# Patient Record
Sex: Female | Born: 1955 | Race: White | Hispanic: No | Marital: Married | Smoking: Never smoker
Health system: Southern US, Community
[De-identification: ages and names within clinical notes are randomized; demographics above are authoritative.]

## PROBLEM LIST (undated history)

## (undated) DIAGNOSIS — E785 Hyperlipidemia, unspecified: Secondary | ICD-10-CM

## (undated) DIAGNOSIS — I10 Essential (primary) hypertension: Secondary | ICD-10-CM

## (undated) DIAGNOSIS — K219 Gastro-esophageal reflux disease without esophagitis: Secondary | ICD-10-CM

## (undated) DIAGNOSIS — K449 Diaphragmatic hernia without obstruction or gangrene: Secondary | ICD-10-CM

## (undated) DIAGNOSIS — E559 Vitamin D deficiency, unspecified: Secondary | ICD-10-CM

## (undated) HISTORY — DX: Essential (primary) hypertension: I10

## (undated) HISTORY — DX: Diaphragmatic hernia without obstruction or gangrene: K44.9

## (undated) HISTORY — DX: Vitamin D deficiency, unspecified: E55.9

## (undated) HISTORY — DX: Hyperlipidemia, unspecified: E78.5

## (undated) HISTORY — DX: Gastro-esophageal reflux disease without esophagitis: K21.9

---

## 2000-04-20 ENCOUNTER — Other Ambulatory Visit: Admission: RE | Admit: 2000-04-20 | Discharge: 2000-04-20 | Payer: Self-pay | Admitting: Obstetrics and Gynecology

## 2001-05-02 ENCOUNTER — Other Ambulatory Visit: Admission: RE | Admit: 2001-05-02 | Discharge: 2001-05-02 | Payer: Self-pay | Admitting: Obstetrics and Gynecology

## 2002-04-15 ENCOUNTER — Encounter: Admission: RE | Admit: 2002-04-15 | Discharge: 2002-04-15 | Payer: Self-pay | Admitting: Emergency Medicine

## 2002-04-15 ENCOUNTER — Encounter: Payer: Self-pay | Admitting: Emergency Medicine

## 2002-05-07 ENCOUNTER — Other Ambulatory Visit: Admission: RE | Admit: 2002-05-07 | Discharge: 2002-05-07 | Payer: Self-pay | Admitting: Obstetrics and Gynecology

## 2002-05-09 ENCOUNTER — Encounter: Payer: Self-pay | Admitting: Obstetrics and Gynecology

## 2002-05-09 ENCOUNTER — Ambulatory Visit (HOSPITAL_COMMUNITY): Admission: RE | Admit: 2002-05-09 | Discharge: 2002-05-09 | Payer: Self-pay | Admitting: Obstetrics and Gynecology

## 2003-03-05 ENCOUNTER — Encounter: Admission: RE | Admit: 2003-03-05 | Discharge: 2003-03-05 | Payer: Self-pay | Admitting: Emergency Medicine

## 2004-05-12 ENCOUNTER — Other Ambulatory Visit: Admission: RE | Admit: 2004-05-12 | Discharge: 2004-05-12 | Payer: Self-pay | Admitting: Obstetrics and Gynecology

## 2006-02-15 ENCOUNTER — Encounter: Admission: RE | Admit: 2006-02-15 | Discharge: 2006-02-15 | Payer: Self-pay | Admitting: Emergency Medicine

## 2008-05-07 ENCOUNTER — Encounter: Admission: RE | Admit: 2008-05-07 | Discharge: 2008-05-07 | Payer: Self-pay | Admitting: Family Medicine

## 2010-07-12 ENCOUNTER — Other Ambulatory Visit: Payer: Self-pay | Admitting: Obstetrics and Gynecology

## 2010-07-12 DIAGNOSIS — Z1231 Encounter for screening mammogram for malignant neoplasm of breast: Secondary | ICD-10-CM

## 2010-07-18 ENCOUNTER — Ambulatory Visit
Admission: RE | Admit: 2010-07-18 | Discharge: 2010-07-18 | Disposition: A | Discharge status: Home / Self Care | Payer: 59 | Source: Ambulatory Visit | Attending: Obstetrics and Gynecology | Admitting: Obstetrics and Gynecology

## 2010-07-18 DIAGNOSIS — Z1231 Encounter for screening mammogram for malignant neoplasm of breast: Secondary | ICD-10-CM

## 2012-06-17 ENCOUNTER — Other Ambulatory Visit: Payer: Self-pay

## 2012-06-17 DIAGNOSIS — Z1231 Encounter for screening mammogram for malignant neoplasm of breast: Secondary | ICD-10-CM

## 2012-07-29 ENCOUNTER — Ambulatory Visit
Admission: RE | Admit: 2012-07-29 | Discharge: 2012-07-29 | Disposition: A | Discharge status: Home / Self Care | Payer: 59 | Source: Ambulatory Visit

## 2012-07-29 DIAGNOSIS — Z1231 Encounter for screening mammogram for malignant neoplasm of breast: Secondary | ICD-10-CM

## 2013-07-04 LAB — CBC AND DIFFERENTIAL
HEMATOCRIT: 42 % (ref 36–46)
Hemoglobin: 13.9 g/dL (ref 12.0–16.0)
NEUTROS ABS: 3 /uL
Platelets: 253 10*3/uL (ref 150–399)
WBC: 5.3 10*3/mL

## 2013-07-04 LAB — HEPATIC FUNCTION PANEL
ALT: 26 U/L (ref 7–35)
AST: 22 U/L (ref 13–35)

## 2013-07-04 LAB — BASIC METABOLIC PANEL
BUN: 15 mg/dL (ref 4–21)
Creatinine: 0.7 mg/dL (ref 0.5–1.1)
GLUCOSE: 98 mg/dL
Potassium: 3.8 mmol/L (ref 3.4–5.3)
Sodium: 138 mmol/L (ref 137–147)

## 2013-07-04 LAB — LIPID PANEL: Cholesterol: 237 mg/dL — AB (ref 0–200)

## 2013-07-22 ENCOUNTER — Other Ambulatory Visit: Payer: Self-pay

## 2013-07-22 DIAGNOSIS — Z1231 Encounter for screening mammogram for malignant neoplasm of breast: Secondary | ICD-10-CM

## 2013-08-05 ENCOUNTER — Encounter (INDEPENDENT_AMBULATORY_CARE_PROVIDER_SITE_OTHER): Payer: Self-pay

## 2013-08-05 ENCOUNTER — Ambulatory Visit
Admission: RE | Admit: 2013-08-05 | Discharge: 2013-08-05 | Disposition: A | Discharge status: Home / Self Care | Payer: BC Managed Care – PPO | Source: Ambulatory Visit

## 2013-08-05 DIAGNOSIS — Z1231 Encounter for screening mammogram for malignant neoplasm of breast: Secondary | ICD-10-CM

## 2013-10-02 ENCOUNTER — Ambulatory Visit (INDEPENDENT_AMBULATORY_CARE_PROVIDER_SITE_OTHER): Payer: BC Managed Care – PPO | Admitting: Internal Medicine

## 2013-10-02 ENCOUNTER — Encounter: Payer: Self-pay | Admitting: Internal Medicine

## 2013-10-02 DIAGNOSIS — K219 Gastro-esophageal reflux disease without esophagitis: Secondary | ICD-10-CM | POA: Insufficient documentation

## 2013-10-02 DIAGNOSIS — I10 Essential (primary) hypertension: Secondary | ICD-10-CM | POA: Insufficient documentation

## 2013-10-02 DIAGNOSIS — Z8639 Personal history of other endocrine, nutritional and metabolic disease: Secondary | ICD-10-CM | POA: Insufficient documentation

## 2013-10-02 DIAGNOSIS — E785 Hyperlipidemia, unspecified: Secondary | ICD-10-CM | POA: Insufficient documentation

## 2013-10-02 MED ORDER — FUROSEMIDE 20 MG PO TABS: 20.0000 mg | ORAL_TABLET | Freq: Every day | ORAL | Status: DC

## 2013-10-02 NOTE — Patient Instructions (Signed)
Please stop HCTZ and start Lasix 20 mg daily in am. Please come back for labs ~1 week before our next appointment in 2 months.

## 2013-10-02 NOTE — Progress Notes (Signed)
Patient ID: Alicia Bailey, female   DOB: 11-Jun-1955, 58 y.o.   MRN: [id number]   HPI  Alicia Bailey is a 58 y.o.-year-old female, referred by her PCP, Dr. Zachery Dauer, for evaluation for hypercalcemia/hyperparathyroidism.  Pt was dx with hypercalcemia in 06/2014. I reviewed pt's pertinent labs - per records from PCP: 09/11/2013: Ca 10.7, iPTH 11 07/06/2013: Ca 11 - on HCTZ 25 mcg daily  Pt has been on HCTZ 25 mg for a long time. She was advised to stop Ca+vit D supplement (600-? once a day) at the end of 06/2013, when Ca was 11 >> Ca checked 2 mo later: 10.7. She continues her MVI.  No previous DEXA scans. No fractures or falls.   No h/o kidney stones.  She has a h/o vitamin D deficiency, but not recently, she has been on Ergocalciferol 2 years ago. Last vit D level was 36.8 in 07/06/2013.  Pt was on calcium and vitamin D, stopped 06/2013; she eats dairy (cheese) and green, leafy, vegetables daily.  No h/o CKD. Last BUN/Cr: 07/06/2013: 15/0.67  No h/o hyperthyroidism, but no recent TFTs available for review.  Pt does not have a FH of hypercalcemia, pituitary tumors, thyroid cancer, or osteoporosis.   I reviewed her chart and she also has a history of HTN, HL.  ROS: Constitutional: + weight gain, + fatigue, no subjective hyperthermia/hypothermia Eyes: no blurry vision, no xerophthalmia ENT: no sore throat, no nodules palpated in throat, no dysphagia/odynophagia, no hoarseness Cardiovascular: no CP/SOB/palpitations/leg swelling Respiratory: no cough/SOB Gastrointestinal: no N/V/D/C/+ heartburn Musculoskeletal: + both: muscle/joint aches Skin: no rashes Neurological: no tremors/numbness/tingling/dizziness Psychiatric: no depression/anxiety  Past Medical History  Diagnosis Date  . Hyperlipidemia   . Hypertension   . GERD (gastroesophageal reflux disease)   . Vitamin D deficiency    History reviewed. No past surgical history.  History   Social History Main Topics  .  Smoking status: Never Smoker   . Smokeless tobacco: No  . Alcohol Use: No  . Drug Use: No   Social History Narrative   Married   Research scientist (medical)   2 children: 33, 31 yrs    First menstrual cycle: 13 yrs   Last menstrual cycle: 40 yrs   3 pregnancies   1 miscarriage   Name  Route  Sig   . estradiol-norethindrone (ACTIVELLA) 1-0.5 MG per tablet   Oral   Take 1 tablet by mouth daily.    Marland Kitchen lisinopril (PRINIVIL,ZESTRIL) 10 MG tablet   Oral   Take 10 mg by mouth daily.    . Multiple Vitamins-Minerals (WOMENS MULTIVITAMIN PLUS PO)   Oral   Take 1 tablet by mouth daily.    . Omega-3 Fatty Acids (FISH OIL PO)   Oral   Take 1 capsule by mouth daily.    . Calcium Carb-Cholecalciferol (CALCIUM 600 + D PO)   Oral   Take 1 capsule by mouth daily.    Marland Kitchen HCTZ 25 MG tablet   Oral   Take 1 tablet by mouth daily.     NKDA  Family History  Problem Relation Age of Onset  . Diabetes Mother   . Hypertension Mother   . Hyperlipidemia Mother   . Hypertension Father   . Hyperlipidemia Father   . Heart disease Father   . Hypertension Sister   . Hypertension Sister    PE: BP 112/64  Pulse 76  Temp(Src) 97.6 F (36.4 C) (Oral)  Resp 12  Ht 5' 2.5" (1.588 m)  Wt 157  lb (71.215 kg)  BMI 28.24 kg/m2  SpO2 97% Wt Readings from Last 3 Encounters:  10/02/13 157 lb (71.215 kg)   Constitutional: overweight, in NAD. No kyphosis. Eyes: PERRLA, EOMI, no exophthalmos ENT: moist mucous membranes, no thyromegaly, no cervical lymphadenopathy Cardiovascular: RRR, No MRG Respiratory: CTA B Gastrointestinal: abdomen soft, NT, ND, BS+ Musculoskeletal: no deformities, strength intact in all 4 Skin: moist, warm, no rashes Neurological: no tremor with outstretched hands, DTR normal in all 4  Assessment: 1. Hypercalcemia with hypoparathyroidism  Plan: Patient has had 2 instances of elevated calcium, with the highest level being at 10.7. A corresponding intact PTH level was low, at 11.  Of note, these were checked while pt was on HCTZ.  No current vitamin D deficiency. No apparent complications from hypercalcemia: no h/o nephrolithiasis, no osteoporosis, no fractures. No abdominal pain, depression, bone pain. - I discussed with the patient about the physiology of calcium and parathyroid hormone, and possible side effects from increased Ca/PTH, including kidney stones, osteoporosis, abdominal pain, depression, etc.  - we discussed that HCTZ can cause elevated serum calcium, and, in the setting of a high calcium, a healthy parathyroid response would be to decrease the production of PTH. Therefore, it is reassuring that her PTH is low while her calcium is high, however, we need to stop her HCTZ and recheck her calcium and PTH level in 2 months, and proceed with further investigation of her hypercalcemia only these are abnormal. In the meantime, to avoid an increase in her blood pressure, we will start furosemide 20 mg daily. I advised her to start checking her blood pressure at home (she does have a cuff) and let me know if her blood pressure is higher. I advised her to come back a week prior to our next appointment in 2 months for the following labs: Orders Placed This Encounter  Procedures  . PTH, Intact and Calcium  . TSH  If these return abnormal, I will then check:  PTHrp Phosphorus vitamin D- 25 HO and 1,25 HO 24h urinary calcium/creatinine ratio - if vit D normal  - I will see the patient back in 2 months  CC: Dr Lavina Hamman

## 2013-11-26 ENCOUNTER — Other Ambulatory Visit: Payer: Self-pay | Admitting: *Deleted

## 2013-11-26 ENCOUNTER — Other Ambulatory Visit (INDEPENDENT_AMBULATORY_CARE_PROVIDER_SITE_OTHER): Payer: BC Managed Care – PPO

## 2013-11-26 LAB — TSH: TSH: 1.36 u[IU]/mL (ref 0.35–4.50)

## 2013-11-27 LAB — PTH, INTACT AND CALCIUM
CALCIUM: 9.9 mg/dL (ref 8.4–10.5)
PTH: 35 pg/mL (ref 14–64)

## 2014-01-25 ENCOUNTER — Other Ambulatory Visit: Payer: Self-pay | Admitting: Internal Medicine

## 2014-01-26 NOTE — Telephone Encounter (Signed)
Further refills per PCP, Dr Zachery DauerBarnes

## 2014-08-05 ENCOUNTER — Other Ambulatory Visit: Payer: Self-pay

## 2014-08-05 DIAGNOSIS — Z1231 Encounter for screening mammogram for malignant neoplasm of breast: Secondary | ICD-10-CM

## 2014-08-13 ENCOUNTER — Ambulatory Visit
Admission: RE | Admit: 2014-08-13 | Discharge: 2014-08-13 | Disposition: A | Discharge status: Home / Self Care | Payer: BLUE CROSS/BLUE SHIELD | Source: Ambulatory Visit

## 2014-08-13 DIAGNOSIS — Z1231 Encounter for screening mammogram for malignant neoplasm of breast: Secondary | ICD-10-CM

## 2015-07-09 DIAGNOSIS — B029 Zoster without complications: Secondary | ICD-10-CM | POA: Diagnosis not present

## 2015-08-10 ENCOUNTER — Other Ambulatory Visit: Payer: Self-pay | Admitting: Obstetrics and Gynecology

## 2015-08-10 DIAGNOSIS — Z1231 Encounter for screening mammogram for malignant neoplasm of breast: Secondary | ICD-10-CM

## 2015-08-20 ENCOUNTER — Ambulatory Visit
Admission: RE | Admit: 2015-08-20 | Discharge: 2015-08-20 | Disposition: A | Discharge status: Home / Self Care | Payer: BLUE CROSS/BLUE SHIELD | Source: Ambulatory Visit | Attending: Obstetrics and Gynecology | Admitting: Obstetrics and Gynecology

## 2015-08-20 DIAGNOSIS — Z1231 Encounter for screening mammogram for malignant neoplasm of breast: Secondary | ICD-10-CM

## 2015-08-31 DIAGNOSIS — E7800 Pure hypercholesterolemia, unspecified: Secondary | ICD-10-CM | POA: Diagnosis not present

## 2015-08-31 DIAGNOSIS — I10 Essential (primary) hypertension: Secondary | ICD-10-CM | POA: Diagnosis not present

## 2015-08-31 DIAGNOSIS — E559 Vitamin D deficiency, unspecified: Secondary | ICD-10-CM | POA: Diagnosis not present

## 2015-08-31 DIAGNOSIS — R609 Edema, unspecified: Secondary | ICD-10-CM | POA: Diagnosis not present

## 2015-09-30 DIAGNOSIS — Z1389 Encounter for screening for other disorder: Secondary | ICD-10-CM | POA: Diagnosis not present

## 2015-09-30 DIAGNOSIS — Z01419 Encounter for gynecological examination (general) (routine) without abnormal findings: Secondary | ICD-10-CM | POA: Diagnosis not present

## 2015-09-30 DIAGNOSIS — Z130 Encounter for screening for diseases of the blood and blood-forming organs and certain disorders involving the immune mechanism: Secondary | ICD-10-CM | POA: Diagnosis not present

## 2015-09-30 DIAGNOSIS — Z6828 Body mass index (BMI) 28.0-28.9, adult: Secondary | ICD-10-CM | POA: Diagnosis not present

## 2015-11-10 DIAGNOSIS — Z23 Encounter for immunization: Secondary | ICD-10-CM | POA: Diagnosis not present

## 2016-03-09 DIAGNOSIS — E7800 Pure hypercholesterolemia, unspecified: Secondary | ICD-10-CM | POA: Diagnosis not present

## 2016-03-09 DIAGNOSIS — I10 Essential (primary) hypertension: Secondary | ICD-10-CM | POA: Diagnosis not present

## 2016-03-09 DIAGNOSIS — R011 Cardiac murmur, unspecified: Secondary | ICD-10-CM | POA: Diagnosis not present

## 2016-03-09 DIAGNOSIS — E559 Vitamin D deficiency, unspecified: Secondary | ICD-10-CM | POA: Diagnosis not present

## 2016-03-31 DIAGNOSIS — Z1211 Encounter for screening for malignant neoplasm of colon: Secondary | ICD-10-CM | POA: Diagnosis not present

## 2016-03-31 DIAGNOSIS — Z01818 Encounter for other preprocedural examination: Secondary | ICD-10-CM | POA: Diagnosis not present

## 2016-04-27 DIAGNOSIS — J028 Acute pharyngitis due to other specified organisms: Secondary | ICD-10-CM | POA: Diagnosis not present

## 2016-04-27 DIAGNOSIS — J069 Acute upper respiratory infection, unspecified: Secondary | ICD-10-CM | POA: Diagnosis not present

## 2016-06-16 ENCOUNTER — Telehealth: Payer: Self-pay

## 2016-06-16 NOTE — Telephone Encounter (Signed)
SENT NOTES TO SCHEDULING 

## 2016-07-14 DIAGNOSIS — Z1211 Encounter for screening for malignant neoplasm of colon: Secondary | ICD-10-CM | POA: Diagnosis not present

## 2016-07-14 DIAGNOSIS — D126 Benign neoplasm of colon, unspecified: Secondary | ICD-10-CM | POA: Diagnosis not present

## 2016-07-18 DIAGNOSIS — Z1211 Encounter for screening for malignant neoplasm of colon: Secondary | ICD-10-CM | POA: Diagnosis not present

## 2016-07-18 DIAGNOSIS — D126 Benign neoplasm of colon, unspecified: Secondary | ICD-10-CM | POA: Diagnosis not present

## 2016-07-25 DIAGNOSIS — H5213 Myopia, bilateral: Secondary | ICD-10-CM | POA: Diagnosis not present

## 2016-09-04 ENCOUNTER — Other Ambulatory Visit: Payer: Self-pay | Admitting: Obstetrics and Gynecology

## 2016-09-04 DIAGNOSIS — Z1231 Encounter for screening mammogram for malignant neoplasm of breast: Secondary | ICD-10-CM

## 2016-09-21 ENCOUNTER — Ambulatory Visit
Admission: RE | Admit: 2016-09-21 | Discharge: 2016-09-21 | Disposition: A | Discharge status: Home / Self Care | Payer: BLUE CROSS/BLUE SHIELD | Source: Ambulatory Visit | Attending: Obstetrics and Gynecology | Admitting: Obstetrics and Gynecology

## 2016-09-21 DIAGNOSIS — Z1231 Encounter for screening mammogram for malignant neoplasm of breast: Secondary | ICD-10-CM

## 2016-11-10 DIAGNOSIS — Z23 Encounter for immunization: Secondary | ICD-10-CM | POA: Diagnosis not present

## 2016-11-23 DIAGNOSIS — Z130 Encounter for screening for diseases of the blood and blood-forming organs and certain disorders involving the immune mechanism: Secondary | ICD-10-CM | POA: Diagnosis not present

## 2016-11-23 DIAGNOSIS — Z6828 Body mass index (BMI) 28.0-28.9, adult: Secondary | ICD-10-CM | POA: Diagnosis not present

## 2016-11-23 DIAGNOSIS — Z01419 Encounter for gynecological examination (general) (routine) without abnormal findings: Secondary | ICD-10-CM | POA: Diagnosis not present

## 2016-11-23 DIAGNOSIS — Z1389 Encounter for screening for other disorder: Secondary | ICD-10-CM | POA: Diagnosis not present

## 2016-11-23 DIAGNOSIS — N951 Menopausal and female climacteric states: Secondary | ICD-10-CM | POA: Diagnosis not present

## 2016-12-15 DIAGNOSIS — E7800 Pure hypercholesterolemia, unspecified: Secondary | ICD-10-CM | POA: Diagnosis not present

## 2016-12-15 DIAGNOSIS — E559 Vitamin D deficiency, unspecified: Secondary | ICD-10-CM | POA: Diagnosis not present

## 2016-12-15 DIAGNOSIS — I10 Essential (primary) hypertension: Secondary | ICD-10-CM | POA: Diagnosis not present

## 2016-12-19 DIAGNOSIS — K449 Diaphragmatic hernia without obstruction or gangrene: Secondary | ICD-10-CM | POA: Diagnosis not present

## 2016-12-19 DIAGNOSIS — I10 Essential (primary) hypertension: Secondary | ICD-10-CM | POA: Diagnosis not present

## 2016-12-19 DIAGNOSIS — E559 Vitamin D deficiency, unspecified: Secondary | ICD-10-CM | POA: Diagnosis not present

## 2016-12-19 DIAGNOSIS — E7800 Pure hypercholesterolemia, unspecified: Secondary | ICD-10-CM | POA: Diagnosis not present

## 2016-12-19 DIAGNOSIS — Z0000 Encounter for general adult medical examination without abnormal findings: Secondary | ICD-10-CM | POA: Diagnosis not present

## 2017-01-04 DIAGNOSIS — H00011 Hordeolum externum right upper eyelid: Secondary | ICD-10-CM | POA: Diagnosis not present

## 2017-01-26 DIAGNOSIS — H40023 Open angle with borderline findings, high risk, bilateral: Secondary | ICD-10-CM | POA: Diagnosis not present

## 2017-01-26 DIAGNOSIS — H40053 Ocular hypertension, bilateral: Secondary | ICD-10-CM | POA: Diagnosis not present

## 2017-01-30 DIAGNOSIS — L814 Other melanin hyperpigmentation: Secondary | ICD-10-CM | POA: Diagnosis not present

## 2017-01-30 DIAGNOSIS — L570 Actinic keratosis: Secondary | ICD-10-CM | POA: Diagnosis not present

## 2017-01-30 DIAGNOSIS — D2261 Melanocytic nevi of right upper limb, including shoulder: Secondary | ICD-10-CM | POA: Diagnosis not present

## 2017-02-07 DIAGNOSIS — J069 Acute upper respiratory infection, unspecified: Secondary | ICD-10-CM | POA: Diagnosis not present

## 2017-11-26 ENCOUNTER — Other Ambulatory Visit: Payer: Self-pay | Admitting: Obstetrics and Gynecology

## 2017-11-26 DIAGNOSIS — Z1231 Encounter for screening mammogram for malignant neoplasm of breast: Secondary | ICD-10-CM

## 2017-11-30 DIAGNOSIS — Z1389 Encounter for screening for other disorder: Secondary | ICD-10-CM | POA: Diagnosis not present

## 2017-11-30 DIAGNOSIS — Z6826 Body mass index (BMI) 26.0-26.9, adult: Secondary | ICD-10-CM | POA: Diagnosis not present

## 2017-11-30 DIAGNOSIS — Z130 Encounter for screening for diseases of the blood and blood-forming organs and certain disorders involving the immune mechanism: Secondary | ICD-10-CM | POA: Diagnosis not present

## 2017-11-30 DIAGNOSIS — Z01419 Encounter for gynecological examination (general) (routine) without abnormal findings: Secondary | ICD-10-CM | POA: Diagnosis not present

## 2017-12-28 DIAGNOSIS — Z23 Encounter for immunization: Secondary | ICD-10-CM | POA: Diagnosis not present

## 2017-12-28 DIAGNOSIS — Z0000 Encounter for general adult medical examination without abnormal findings: Secondary | ICD-10-CM | POA: Diagnosis not present

## 2017-12-28 DIAGNOSIS — E7800 Pure hypercholesterolemia, unspecified: Secondary | ICD-10-CM | POA: Diagnosis not present

## 2017-12-28 DIAGNOSIS — E559 Vitamin D deficiency, unspecified: Secondary | ICD-10-CM | POA: Diagnosis not present

## 2017-12-28 DIAGNOSIS — I10 Essential (primary) hypertension: Secondary | ICD-10-CM | POA: Diagnosis not present

## 2017-12-28 DIAGNOSIS — Z1159 Encounter for screening for other viral diseases: Secondary | ICD-10-CM | POA: Diagnosis not present

## 2017-12-31 ENCOUNTER — Other Ambulatory Visit (HOSPITAL_COMMUNITY): Payer: Self-pay | Admitting: Family Medicine

## 2017-12-31 DIAGNOSIS — R011 Cardiac murmur, unspecified: Secondary | ICD-10-CM

## 2018-01-11 ENCOUNTER — Ambulatory Visit: Payer: BLUE CROSS/BLUE SHIELD

## 2018-01-11 DIAGNOSIS — H1089 Other conjunctivitis: Secondary | ICD-10-CM | POA: Diagnosis not present

## 2018-02-21 ENCOUNTER — Ambulatory Visit
Admission: RE | Admit: 2018-02-21 | Discharge: 2018-02-21 | Disposition: A | Discharge status: Home / Self Care | Payer: BLUE CROSS/BLUE SHIELD | Source: Ambulatory Visit | Attending: Obstetrics and Gynecology | Admitting: Obstetrics and Gynecology

## 2018-02-21 DIAGNOSIS — Z1231 Encounter for screening mammogram for malignant neoplasm of breast: Secondary | ICD-10-CM | POA: Diagnosis not present

## 2018-02-26 DIAGNOSIS — H40013 Open angle with borderline findings, low risk, bilateral: Secondary | ICD-10-CM | POA: Diagnosis not present

## 2018-02-28 ENCOUNTER — Ambulatory Visit (HOSPITAL_COMMUNITY): Payer: BLUE CROSS/BLUE SHIELD | Attending: Cardiology

## 2018-02-28 ENCOUNTER — Other Ambulatory Visit (HOSPITAL_COMMUNITY): Payer: BLUE CROSS/BLUE SHIELD

## 2018-02-28 DIAGNOSIS — R011 Cardiac murmur, unspecified: Secondary | ICD-10-CM | POA: Diagnosis not present

## 2018-03-20 ENCOUNTER — Encounter: Payer: Self-pay | Admitting: Cardiology

## 2018-03-27 ENCOUNTER — Encounter: Payer: Self-pay | Admitting: *Deleted

## 2018-03-27 DIAGNOSIS — N951 Menopausal and female climacteric states: Secondary | ICD-10-CM | POA: Insufficient documentation

## 2018-04-03 ENCOUNTER — Telehealth: Payer: Self-pay | Admitting: Cardiology

## 2018-04-03 NOTE — Telephone Encounter (Signed)
Patient returned call,would like a call back 

## 2018-04-03 NOTE — Telephone Encounter (Signed)
Patient called today to reschedule office visit due to coronavirus pandemic.  Patient was referred for evaluation of 2D echocardiogram showing a bicuspid aortic valve with no significant aortic stenosis or AI.  There was mild to moderate TR as well.  Please contact patient to make sure she is not having any symptoms.  If she is not having any symptoms then okay to reschedule as a priority #3.

## 2018-04-03 NOTE — Telephone Encounter (Signed)
Dr. Mayford Knife reviewed the echo with the patient. The patient was asymptomatic and Dr. Mayford Knife wanted to follow up with her in 3 months.

## 2018-04-04 ENCOUNTER — Ambulatory Visit: Payer: BLUE CROSS/BLUE SHIELD | Admitting: Cardiology

## 2018-08-02 ENCOUNTER — Telehealth: Payer: Self-pay | Admitting: Cardiology

## 2018-08-02 NOTE — Telephone Encounter (Signed)
New Message ° ° ° °Left message to confirm appt and answer covid questions  °

## 2018-08-05 ENCOUNTER — Ambulatory Visit: Payer: BLUE CROSS/BLUE SHIELD | Admitting: Cardiology

## 2018-08-23 DIAGNOSIS — H40013 Open angle with borderline findings, low risk, bilateral: Secondary | ICD-10-CM | POA: Diagnosis not present

## 2018-09-03 ENCOUNTER — Encounter: Payer: Self-pay | Admitting: Cardiology

## 2018-09-17 ENCOUNTER — Ambulatory Visit: Payer: BC Managed Care – PPO | Admitting: Cardiology

## 2018-10-21 ENCOUNTER — Telehealth: Payer: Self-pay | Admitting: Cardiology

## 2018-10-21 NOTE — Telephone Encounter (Signed)
Left message for pt letting her know it would be better to do virtual or move appt since we do not know what is causing her cough.  Advised to call back and adjust appt.

## 2018-10-21 NOTE — Telephone Encounter (Signed)
New Message:      Pt has an appointment with Dr Radford Pax tomorrow. She says she have an allergy like cold. She have been coughing for about a week, but no fever. She wants to know if she should still come in for her appointment tomorrow?

## 2018-10-22 ENCOUNTER — Encounter: Payer: Self-pay | Admitting: Cardiology

## 2018-10-22 ENCOUNTER — Telehealth (INDEPENDENT_AMBULATORY_CARE_PROVIDER_SITE_OTHER): Payer: BC Managed Care – PPO | Admitting: Cardiology

## 2018-10-22 ENCOUNTER — Other Ambulatory Visit: Payer: Self-pay

## 2018-10-22 VITALS — Ht 62.5 in | Wt 153.0 lb

## 2018-10-22 DIAGNOSIS — I10 Essential (primary) hypertension: Secondary | ICD-10-CM

## 2018-10-22 DIAGNOSIS — I519 Heart disease, unspecified: Secondary | ICD-10-CM | POA: Diagnosis not present

## 2018-10-22 DIAGNOSIS — I071 Rheumatic tricuspid insufficiency: Secondary | ICD-10-CM

## 2018-10-22 NOTE — Progress Notes (Signed)
Virtual Visit via Video Note   This visit type was conducted due to national recommendations for restrictions regarding the COVID-19 Pandemic (e.g. social distancing) in an effort to limit this patient's exposure and mitigate transmission in our community.  Due to her co-morbid illnesses, this patient is at least at moderate risk for complications without adequate follow up.  This format is felt to be most appropriate for this patient at this time.  All issues noted in this document were discussed and addressed.  A limited physical exam was performed with this format.  Please refer to the patient's chart for her consent to telehealth for [facility].   Evaluation Performed:  Cardiology Consult  This visit type was conducted due to national recommendations for restrictions regarding the COVID-19 Pandemic (e.g. social distancing).  This format is felt to be most appropriate for this patient at this time.  All issues noted in this document were discussed and addressed.  No physical exam was performed (except for noted visual exam findings with Video Visits).  Please refer to the patient's chart (MyChart message for video visits and phone note for telephone visits) for the patient's consent to telehealth for [facility].  Date:  10/22/2018   ID:  Alicia Bailey, DOB 09-29-1955, MRN [id number]  Patient Location:  Home  Provider location:   [place]  PCP:  Juluis Rainier, MD  Cardiologist:  NEW Electrophysiologist:  None   Chief Complaint:  Abnormal echo with mild to moderate TR  History of Present Illness:    Alicia Bailey is a 63 y.o. female who presents via audio/video conferencing for a telehealth visit today in referral by Juluis Rainier, MD for evaluation of mild to moderate TR.  She had a routine PE in February and was noted to have a heart murmur.  2D echo was done which showed normal LVF with EF 55-60% with increased stiffness of heart muscle, mildly reduced RVF and  mild to moderate TR.  AV was poorly visualized but could not rule out BAV. PASP was .    She is here today for followup and is doing well.  She denies any chest pain or pressure, SOB, DOE, PND, orthopnea, LE edema, dizziness, palpitations or syncope. She is compliant with her meds and is tolerating meds with no SE.    The patient does not have symptoms concerning for COVID-19 infection (fever, chills, cough, or new shortness of breath).    Prior CV studies:   The following studies were reviewed today:  2D echo  Past Medical History:  Diagnosis Date  . GERD (gastroesophageal reflux disease)   . Hiatal hernia   . Hyperlipidemia   . Hypertension   . Vitamin D deficiency    No past surgical history on file.   Current Meds  Medication Sig  . furosemide (LASIX) 20 MG tablet Take 20 mg by mouth daily.  Marland Kitchen lisinopril (PRINIVIL,ZESTRIL) 10 MG tablet Take 10 mg by mouth daily.  . Multiple Vitamins-Minerals (WOMENS MULTIVITAMIN PLUS PO) Take 1 tablet by mouth daily.  . Omega-3 Fatty Acids (FISH OIL PO) Take 1 capsule by mouth daily.     Allergies:   Patient has no known allergies.   Social History   Tobacco Use  . Smoking status: Never Smoker  . Smokeless tobacco: Never Used  Substance Use Topics  . Alcohol use: No  . Drug use: No     Family Hx: The patient's family history includes Breast cancer (age of onset: 74) in  her mother; CAD in her father; Diabetes in her mother; Heart disease in her father; Hyperlipidemia in her father and mother; Hypertension in her father, mother, sister, and sister.  ROS:   Please see the history of present illness.     All other systems reviewed and are negative.   Labs/Other Tests and Data Reviewed:    Recent Labs: No results found for requested labs within last 8760 hours.   Recent Lipid Panel Lab Results  Component Value Date/Time   CHOL 237 (A) 07/04/2013    Wt Readings from Last 3 Encounters:  10/22/18 153 lb (69.4 kg)   10/02/13 157 lb (71.2 kg)     Objective:    Vital Signs:  Ht 5' 2.5" (1.588 m)   Wt 153 lb (69.4 kg)   BMI 27.54 kg/m    CONSTITUTIONAL:  Well nourished, well developed female in no acute distress.  EYES: anicteric MOUTH: oral mucosa is pink RESPIRATORY: Normal respiratory effort, symmetric expansion CARDIOVASCULAR: No peripheral edema SKIN: No rash, lesions or ulcers MUSCULOSKELETAL: no digital cyanosis NEURO: Cranial Nerves II-XII grossly intact, moves all extremities PSYCH: Intact judgement and insight.  A&O x 3, Mood/affect appropriate   ASSESSMENT & PLAN:    1.  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  Mild RV dysfunction -TAPSE normal on echo -no significant pulmonary HTN -I will check a cardiac MRI to assess RV further and rule out RV dysplasia -is RVF noted to be reduced then will get VQ scan to rule out chronic thromboembolic dz as well as PFTs and sleep study.  3.  HTN -BP controlled -continue Lisinopril 10mg  daily  COVID-19 Education: The signs and symptoms of COVID-19 were discussed with the patient and how to seek care for testing (follow up with PCP or arrange E-visit).  The importance of social distancing was discussed today.  Patient Risk:   After full review of this patient's clinical status, I feel that they are at least moderate risk at this time.  Time:   Today, I have spent 20 minutes directly with the patient on telemedicine discussing medical problems including TR.  We also reviewed the symptoms of COVID 19 and the ways to protect against contracting the virus with telehealth technology.  I spent an additional 5 minutes reviewing patient's chart including 2D echo.  Medication Adjustments/Labs and Tests Ordered: Current medicines are reviewed at length with the patient today.  Concerns regarding medicines are outlined above.  Tests Ordered: No orders of the defined types were placed in this encounter.  Medication Changes: No  orders of the defined types were placed in this encounter.   Disposition:  Follow up in 1 year(s)  Signed, Fransico Him, MD  10/22/2018 8:40 AM    [facility]

## 2018-10-22 NOTE — Patient Instructions (Signed)
Medication Instructions:  If you need a refill on your cardiac medications before your next appointment, please call your pharmacy.   Lab work: If you have labs (blood work) drawn today and your tests are completely normal, you will receive your results only by: Marland Kitchen MyChart Message (if you have MyChart) OR . A paper copy in the mail If you have any lab test that is abnormal or we need to change your treatment, we will call you to review the results.  Testing/Procedures: Your physician has requested that you have a cardiac MRI. Cardiac MRI uses a computer to create images of your heart as its beating, producing both still and moving pictures of your heart and major blood vessels. For further information please visit [email or web address] Please follow the instruction sheet given to you today for more information.  Follow-Up: At [facility], you and your health needs are our priority.  As part of our continuing mission to provide you with exceptional heart care, we have created designated Provider Care Teams.  These Care Teams include your primary Cardiologist (physician) and Advanced Practice Providers (APPs -  Physician Assistants and Nurse Practitioners) who all work together to provide you with the care you need, when you need it. Your physician recommends that you schedule a follow-up appointment as needed with Dr. Radford Pax.

## 2018-10-22 NOTE — Addendum Note (Signed)
Addended by: Aris Georgia, Telly Jawad L on: 10/22/2018 09:05 AM   Modules accepted: Orders

## 2018-10-25 ENCOUNTER — Encounter: Payer: Self-pay | Admitting: Cardiology

## 2018-11-04 IMAGING — MG 2D DIGITAL SCREENING BILATERAL MAMMOGRAM WITH CAD AND ADJUNCT TO
8 of 13 series · 8 of 29 positions shown · non-contrast
Comparison: Previous exam(s).

CLINICAL DATA: Screening.

EXAM:
2D DIGITAL SCREENING BILATERAL MAMMOGRAM WITH CAD AND ADJUNCT TOMO

[L CC (1 of 2)]
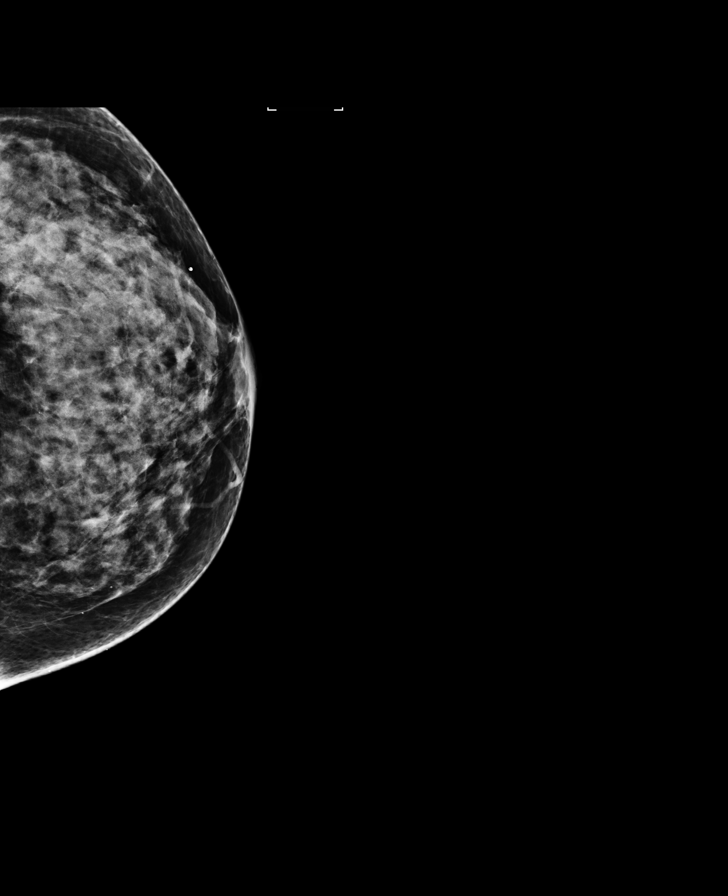

[R MLO]
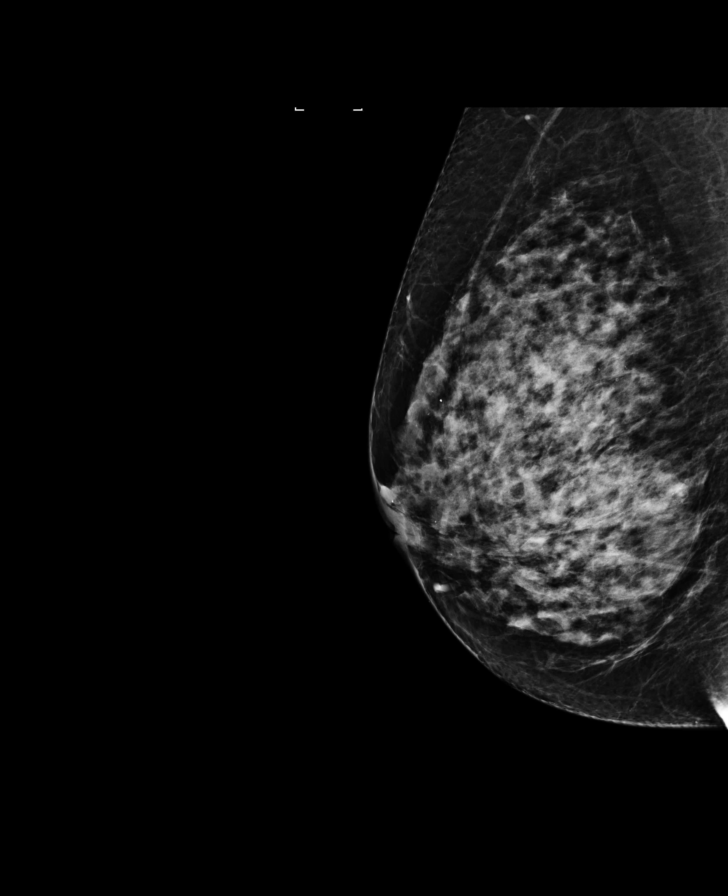

[L CC synth-2D]
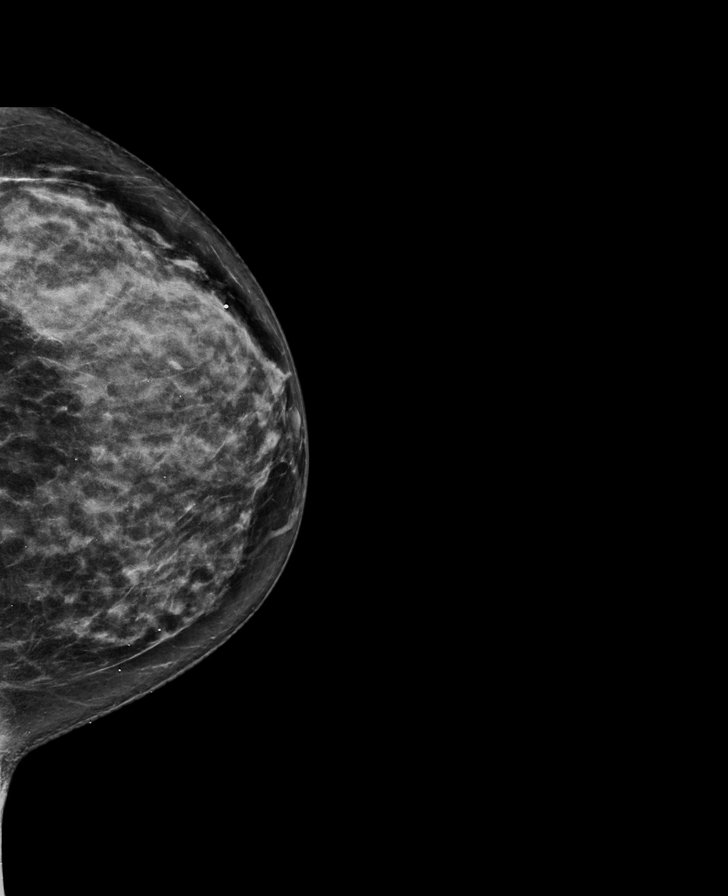

[L CC (2 of 2)]
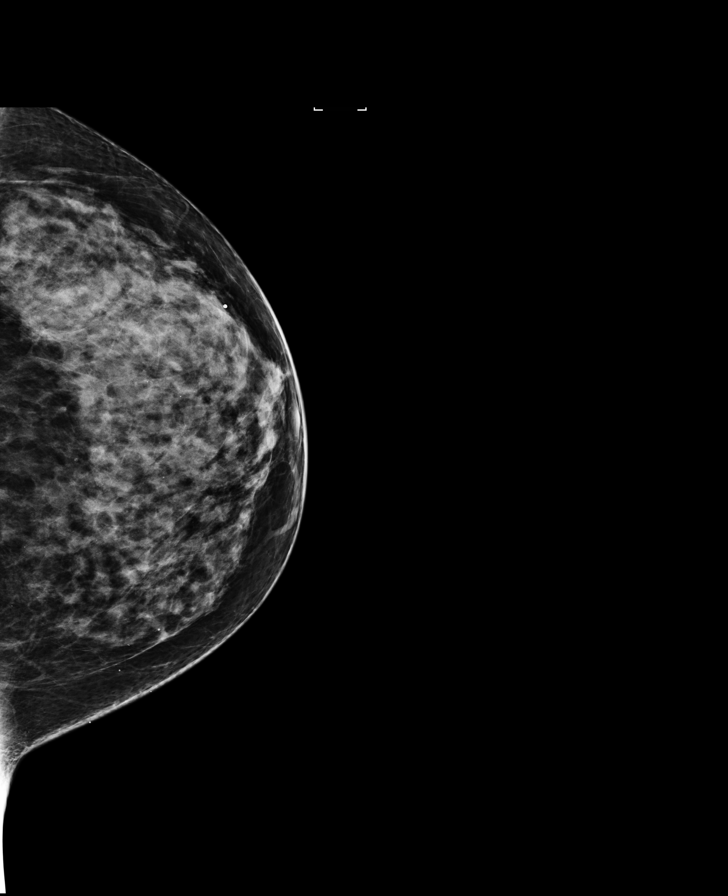

[R CC]
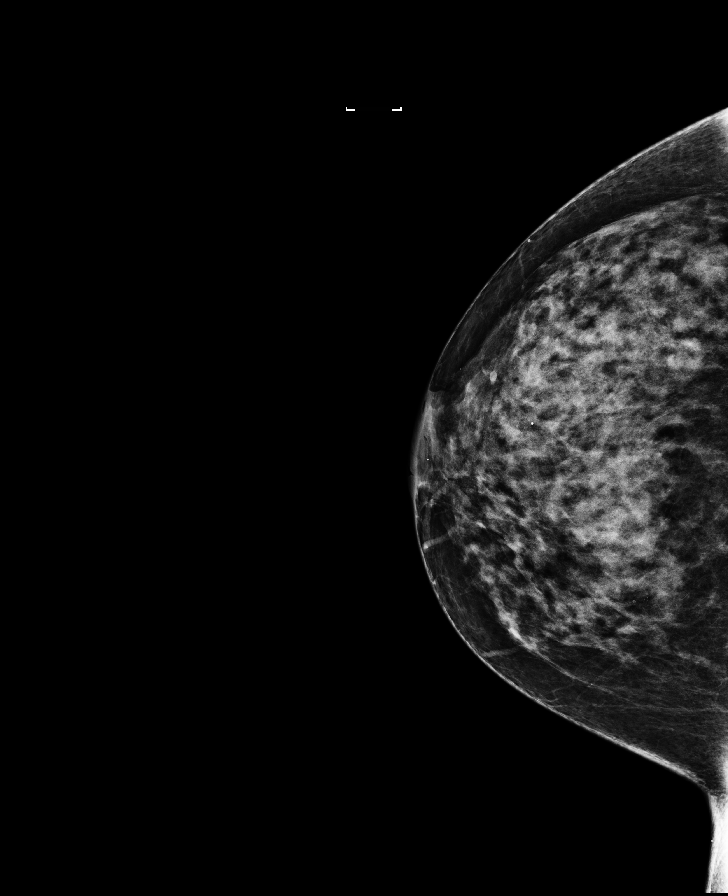

[R MLO synth-2D]
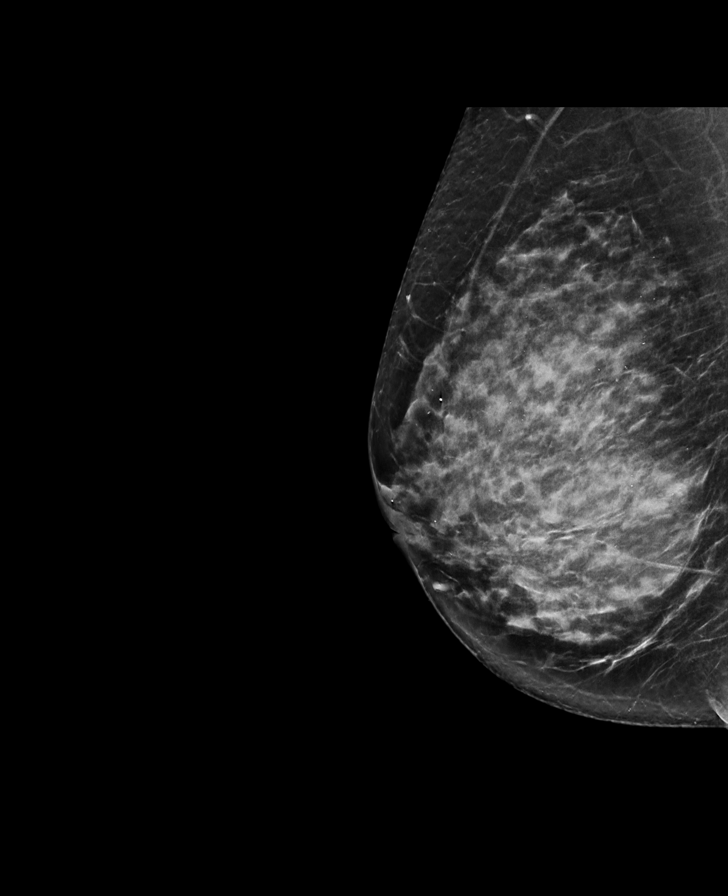

[R CC synth-2D]
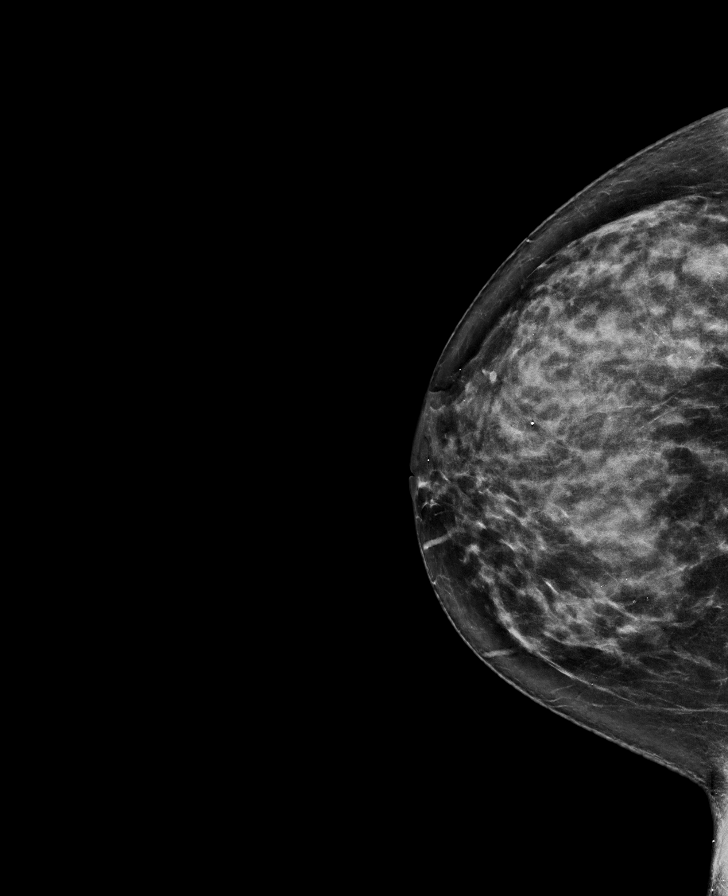

[L MLO synth-2D]
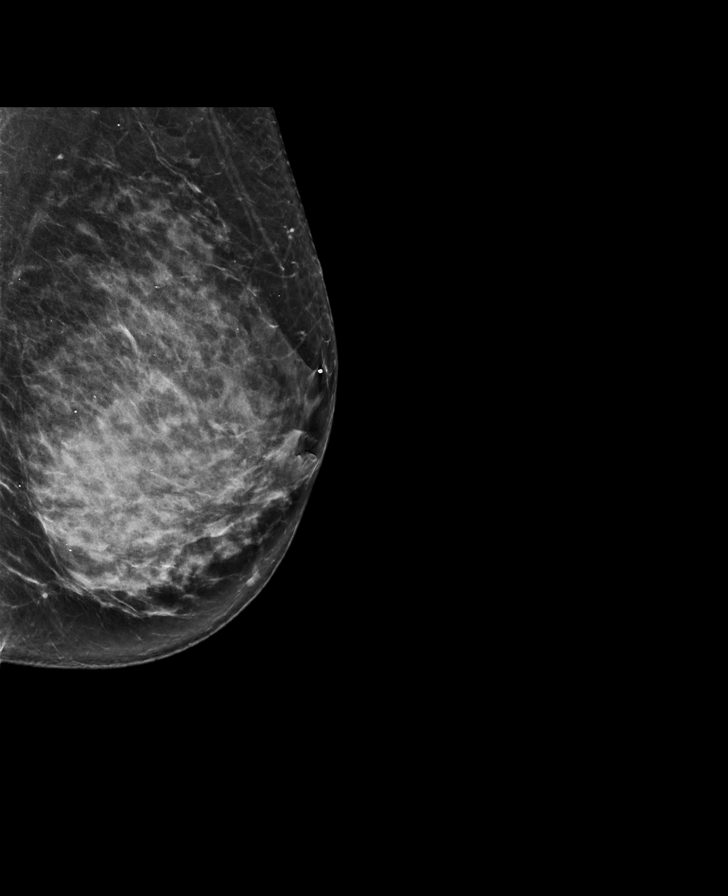

[8 of 29 positions shown; findings below may reference images not displayed]

ACR Breast Density Category d: The breast tissue is extremely dense,
which lowers the sensitivity of mammography.
FINDINGS: There are no findings suspicious for malignancy. Images were
processed with CAD.
IMPRESSION: No mammographic evidence of malignancy. A result letter of this
screening mammogram will be mailed directly to the patient.

RECOMMENDATION:
Screening mammogram in one year. (Code:US-D-RZ7)

BI-RADS CATEGORY  1: Negative.

## 2018-11-14 ENCOUNTER — Telehealth (HOSPITAL_COMMUNITY): Payer: Self-pay | Admitting: Emergency Medicine

## 2018-11-14 NOTE — Telephone Encounter (Signed)
Reaching out to patient to offer assistance regarding upcoming cardiac imaging study; pt verbalizes understanding of appt date/time, parking situation and where to check in, and verified current allergies; name and call back number provided for further questions should they arise Marchia Bond RN Navigator Cardiac Imaging [facility] [phone] office [phone] cell  Pt denies implants, denies claustrophobia

## 2018-11-15 ENCOUNTER — Other Ambulatory Visit: Payer: Self-pay

## 2018-11-15 ENCOUNTER — Telehealth: Payer: Self-pay

## 2018-11-15 ENCOUNTER — Ambulatory Visit (HOSPITAL_COMMUNITY)
Admission: RE | Admit: 2018-11-15 | Discharge: 2018-11-15 | Disposition: A | Discharge status: Home / Self Care | Payer: BC Managed Care – PPO | Source: Ambulatory Visit | Attending: Cardiology | Admitting: Cardiology

## 2018-11-15 DIAGNOSIS — I10 Essential (primary) hypertension: Secondary | ICD-10-CM | POA: Diagnosis not present

## 2018-11-15 DIAGNOSIS — I519 Heart disease, unspecified: Secondary | ICD-10-CM | POA: Insufficient documentation

## 2018-11-15 DIAGNOSIS — I071 Rheumatic tricuspid insufficiency: Secondary | ICD-10-CM | POA: Insufficient documentation

## 2018-11-15 LAB — CREATININE, SERUM
Creatinine, Ser: 0.67 mg/dL (ref 0.44–1.00)
GFR calc Af Amer: >60 mL/min (ref >60)
GFR calc non Af Amer: >60 mL/min (ref >60)

## 2018-11-15 MED ORDER — GADOBUTROL 1 MMOL/ML IV SOLN
8.0000 mL | Freq: Once | INTRAVENOUS | Status: AC | PRN
  Administered 2018-11-15: 8 mL via INTRAVENOUS

## 2018-11-15 NOTE — Telephone Encounter (Signed)
Notes recorded by Frederik Schmidt, RN on 11/15/2018 at 3:32 PM EDT  The patient has been notified of the result and verbalized understanding. All questions (if any) were answered.  Frederik Schmidt, RN 11/15/2018 3:32 PM

## 2018-11-15 NOTE — Telephone Encounter (Signed)
-----   Message from Sueanne Margarita, MD sent at 11/15/2018 10:03 AM EDT ----- Please let patient know that labs were normal.  Continue current medical therapy.

## 2018-11-18 ENCOUNTER — Telehealth: Payer: Self-pay

## 2018-11-18 NOTE — Telephone Encounter (Signed)
-----   Message from Sueanne Margarita, MD sent at 11/18/2018  8:19 AM EST ----- Cardiac MRI normal with mildly leaky TV

## 2018-11-18 NOTE — Telephone Encounter (Signed)
Notes recorded by Frederik Schmidt, RN on 11/18/2018 at 9:23 AM EST  The patient has been notified of the result and verbalized understanding. All questions (if any) were answered.  Frederik Schmidt, RN 11/18/2018 9:23 AM

## 2018-12-30 DIAGNOSIS — E7800 Pure hypercholesterolemia, unspecified: Secondary | ICD-10-CM | POA: Diagnosis not present

## 2018-12-30 DIAGNOSIS — K219 Gastro-esophageal reflux disease without esophagitis: Secondary | ICD-10-CM | POA: Diagnosis not present

## 2018-12-30 DIAGNOSIS — I10 Essential (primary) hypertension: Secondary | ICD-10-CM | POA: Diagnosis not present

## 2018-12-30 DIAGNOSIS — E559 Vitamin D deficiency, unspecified: Secondary | ICD-10-CM | POA: Diagnosis not present

## 2019-07-14 ENCOUNTER — Other Ambulatory Visit: Payer: Self-pay | Admitting: Obstetrics and Gynecology

## 2019-07-14 DIAGNOSIS — Z1231 Encounter for screening mammogram for malignant neoplasm of breast: Secondary | ICD-10-CM

## 2019-07-30 ENCOUNTER — Other Ambulatory Visit: Payer: Self-pay

## 2019-07-30 ENCOUNTER — Ambulatory Visit
Admission: RE | Admit: 2019-07-30 | Discharge: 2019-07-30 | Disposition: A | Discharge status: Home / Self Care | Payer: BC Managed Care – PPO | Source: Ambulatory Visit | Attending: Obstetrics and Gynecology | Admitting: Obstetrics and Gynecology

## 2019-07-30 DIAGNOSIS — Z1231 Encounter for screening mammogram for malignant neoplasm of breast: Secondary | ICD-10-CM | POA: Diagnosis not present

## 2019-09-24 DIAGNOSIS — R0989 Other specified symptoms and signs involving the circulatory and respiratory systems: Secondary | ICD-10-CM | POA: Diagnosis not present

## 2019-09-24 DIAGNOSIS — R05 Cough: Secondary | ICD-10-CM | POA: Diagnosis not present

## 2019-09-24 DIAGNOSIS — J392 Other diseases of pharynx: Secondary | ICD-10-CM | POA: Diagnosis not present

## 2019-09-30 DIAGNOSIS — R05 Cough: Secondary | ICD-10-CM | POA: Diagnosis not present

## 2020-01-21 DIAGNOSIS — E7800 Pure hypercholesterolemia, unspecified: Secondary | ICD-10-CM | POA: Diagnosis not present

## 2020-01-21 DIAGNOSIS — I10 Essential (primary) hypertension: Secondary | ICD-10-CM | POA: Diagnosis not present

## 2020-01-22 DIAGNOSIS — H401131 Primary open-angle glaucoma, bilateral, mild stage: Secondary | ICD-10-CM | POA: Diagnosis not present

## 2020-01-24 DIAGNOSIS — U071 COVID-19: Secondary | ICD-10-CM | POA: Diagnosis not present

## 2020-01-24 DIAGNOSIS — J069 Acute upper respiratory infection, unspecified: Secondary | ICD-10-CM | POA: Diagnosis not present

## 2020-02-05 DIAGNOSIS — H401131 Primary open-angle glaucoma, bilateral, mild stage: Secondary | ICD-10-CM | POA: Diagnosis not present

## 2020-02-10 DIAGNOSIS — I10 Essential (primary) hypertension: Secondary | ICD-10-CM | POA: Diagnosis not present

## 2020-02-10 DIAGNOSIS — E7800 Pure hypercholesterolemia, unspecified: Secondary | ICD-10-CM | POA: Diagnosis not present

## 2020-07-13 ENCOUNTER — Other Ambulatory Visit: Payer: Self-pay | Admitting: Obstetrics and Gynecology

## 2020-07-13 DIAGNOSIS — R600 Localized edema: Secondary | ICD-10-CM | POA: Diagnosis not present

## 2020-07-13 DIAGNOSIS — Z0000 Encounter for general adult medical examination without abnormal findings: Secondary | ICD-10-CM | POA: Diagnosis not present

## 2020-07-13 DIAGNOSIS — Z1231 Encounter for screening mammogram for malignant neoplasm of breast: Secondary | ICD-10-CM

## 2020-07-13 DIAGNOSIS — I10 Essential (primary) hypertension: Secondary | ICD-10-CM | POA: Diagnosis not present

## 2020-09-07 ENCOUNTER — Ambulatory Visit
Admission: RE | Admit: 2020-09-07 | Discharge: 2020-09-07 | Disposition: A | Discharge status: Home / Self Care | Payer: BC Managed Care – PPO | Source: Ambulatory Visit | Attending: Obstetrics and Gynecology | Admitting: Obstetrics and Gynecology

## 2020-09-07 ENCOUNTER — Other Ambulatory Visit: Payer: Self-pay

## 2020-09-07 DIAGNOSIS — Z1231 Encounter for screening mammogram for malignant neoplasm of breast: Secondary | ICD-10-CM

## 2020-09-08 DIAGNOSIS — Z124 Encounter for screening for malignant neoplasm of cervix: Secondary | ICD-10-CM | POA: Diagnosis not present

## 2020-09-08 DIAGNOSIS — Z130 Encounter for screening for diseases of the blood and blood-forming organs and certain disorders involving the immune mechanism: Secondary | ICD-10-CM | POA: Diagnosis not present

## 2020-09-08 DIAGNOSIS — Z1151 Encounter for screening for human papillomavirus (HPV): Secondary | ICD-10-CM | POA: Diagnosis not present

## 2020-09-08 DIAGNOSIS — Z01419 Encounter for gynecological examination (general) (routine) without abnormal findings: Secondary | ICD-10-CM | POA: Diagnosis not present

## 2020-09-10 ENCOUNTER — Other Ambulatory Visit: Payer: Self-pay | Admitting: Obstetrics and Gynecology

## 2020-09-10 DIAGNOSIS — Z780 Asymptomatic menopausal state: Secondary | ICD-10-CM

## 2020-12-03 DIAGNOSIS — Z23 Encounter for immunization: Secondary | ICD-10-CM | POA: Diagnosis not present

## 2021-01-11 DIAGNOSIS — I10 Essential (primary) hypertension: Secondary | ICD-10-CM | POA: Diagnosis not present

## 2021-02-02 ENCOUNTER — Ambulatory Visit
Admission: RE | Admit: 2021-02-02 | Discharge: 2021-02-02 | Disposition: A | Discharge status: Home / Self Care | Payer: BC Managed Care – PPO | Source: Ambulatory Visit | Attending: Obstetrics and Gynecology | Admitting: Obstetrics and Gynecology

## 2021-02-02 DIAGNOSIS — Z780 Asymptomatic menopausal state: Secondary | ICD-10-CM

## 2021-02-02 DIAGNOSIS — M8589 Other specified disorders of bone density and structure, multiple sites: Secondary | ICD-10-CM | POA: Diagnosis not present

## 2021-03-02 DIAGNOSIS — R059 Cough, unspecified: Secondary | ICD-10-CM | POA: Diagnosis not present

## 2021-03-10 DIAGNOSIS — H401131 Primary open-angle glaucoma, bilateral, mild stage: Secondary | ICD-10-CM | POA: Diagnosis not present

## 2021-04-27 ENCOUNTER — Ambulatory Visit (INDEPENDENT_AMBULATORY_CARE_PROVIDER_SITE_OTHER): Payer: BC Managed Care – PPO | Admitting: Orthopaedic Surgery

## 2021-04-27 ENCOUNTER — Ambulatory Visit (INDEPENDENT_AMBULATORY_CARE_PROVIDER_SITE_OTHER): Payer: BC Managed Care – PPO

## 2021-04-27 ENCOUNTER — Encounter: Payer: Self-pay | Admitting: Orthopaedic Surgery

## 2021-04-27 VITALS — Ht 62.0 in | Wt 156.0 lb

## 2021-04-27 DIAGNOSIS — M25562 Pain in left knee: Secondary | ICD-10-CM

## 2021-04-27 DIAGNOSIS — M1712 Unilateral primary osteoarthritis, left knee: Secondary | ICD-10-CM

## 2021-04-27 DIAGNOSIS — G8929 Other chronic pain: Secondary | ICD-10-CM | POA: Diagnosis not present

## 2021-04-27 MED ORDER — METHYLPREDNISOLONE ACETATE 40 MG/ML IJ SUSP
80.0000 mg | INTRAMUSCULAR | Status: AC | PRN
  Administered 2021-04-27: 80 mg via INTRA_ARTICULAR

## 2021-04-27 MED ORDER — BUPIVACAINE HCL 0.25 % IJ SOLN
2.0000 mL | INTRAMUSCULAR | Status: AC | PRN
  Administered 2021-04-27: 2 mL via INTRA_ARTICULAR

## 2021-04-27 MED ORDER — LIDOCAINE HCL 1 % IJ SOLN
2.0000 mL | INTRAMUSCULAR | Status: AC | PRN
  Administered 2021-04-27: 2 mL

## 2021-04-27 NOTE — Progress Notes (Signed)
? ?Office Visit Note ?  ?Patient: Alicia Bailey           ?Date of Birth: Feb 22, 1955           ?MRN: [id number] ?Visit Date: 04/27/2021 ?             ?Requested by: No referring provider defined for this encounter. ?PCP: Alicia Rainier, MD (Inactive) ? ? ?Assessment & Plan: ?Visit Diagnoses:  ?1. Chronic pain of left knee   ?2. Unilateral primary osteoarthritis, left knee   ? ? ?Plan: Moderate changes of osteoarthritis left knee.  Alicia Bailey has had an exacerbation of her chronic pain over the past several weeks without history of injury or trauma or unusual activity.  She has had some swelling and mostly medial joint pain.  She has tried over-the-counter medicines.  Films demonstrate tricompartmental degenerative changes predominantly in the medial compartment with there is narrowing and some subchondral sclerosis.  Long discussion regarding different treatment options and her diagnosis of osteoarthritis and what she may expect over time.  Will inject the knee with cortisone today and monitor her response.  She may be a candidate for viscosupplementation over time but also discussed exercises.  Her job requires her to sit a good part of the day and I think the exercises would be helpful ? ?Follow-Up Instructions: Return if symptoms worsen or fail to improve.  ? ?Orders:  ?Orders Placed This Encounter  ?Procedures  ? XR KNEE 3 VIEW LEFT  ? ?No orders of the defined types were placed in this encounter. ? ? ? ? Procedures: ?Large Joint Inj: L knee on 04/27/2021 9:29 AM ?Indications: pain and diagnostic evaluation ?Details: 25 G 1.5 in needle, anteromedial approach ? ?Arthrogram: No ? ?Medications: 2 mL lidocaine 1 %; 80 mg methylPREDNISolone acetate 40 MG/ML; 2 mL bupivacaine 0.25 % ?Procedure, treatment alternatives, risks and benefits explained, specific risks discussed. Consent was given by the patient. Patient was prepped and draped in the usual sterile fashion.  ? ? ? ? ?Clinical Data: ?No additional  findings. ? ? ?Subjective: ?Chief Complaint  ?Patient presents with  ? Left Knee - Pain  ?Patient presents today for left knee pain. She said that it has been hurting for three weeks. No recent injury. She said that her pain is located medially. It will occasionally throb. She has been taking tylenol regularly for pain. No prior knee surgery. She states that she has a history of a ligament tear 20years ago.  ? ?HPI ? ?Review of Systems ? ? ?Objective: ?Vital Signs: Ht 5\' 2"  (1.575 m)   Wt 156 lb (70.8 kg)   BMI 28.53 kg/m?  ? ?Physical Exam ?Constitutional:   ?   Appearance: She is well-developed.  ?Eyes:  ?   Pupils: Pupils are equal, round, and reactive to light.  ?Pulmonary:  ?   Effort: Pulmonary effort is normal.  ?Skin: ?   General: Skin is warm and dry.  ?Neurological:  ?   Mental Status: She is alert and oriented to person, place, and time.  ?Psychiatric:     ?   Behavior: Behavior normal.  ? ? ?Ortho Exam awake alert and oriented x3 comfortable sitting.  Walks without a limp.  Left knee was not hot red or swollen.  Mostly medial joint pain.  Alignment appears to be neutral.  No patella crepitation or pain laterally.  She did have a little fullness in the popliteal space but no pain.  No calf pain.  Neurologically intact.  Full extension flex to least 102 to 3 degrees without instability ? ?Specialty Comments:  ?No specialty comments available. ? ?Imaging: ?XR KNEE 3 VIEW LEFT ? ?Result Date: 04/27/2021 ?Films of the left knee are obtained in 3 projections standing.  There is some narrowing of the medial joint space with mild subchondral sclerosis and some subchondral cysts.  There are few osteophytes medially as well.  Alignment is about neutral.  Some mild degenerative changes in the lateral and patellofemoral compartments.  No loose bodies.  No ectopic calcification.  Films are consistent with moderate osteoarthritis predominantly in the medial compartment  ? ? ?PMFS History: ?Patient Active Problem List   ? Diagnosis Date Noted  ? Unilateral primary osteoarthritis, left knee 04/27/2021  ? Menopausal symptom 03/27/2018  ? Hypercalcemia 10/02/2013  ? HTN (hypertension) 10/02/2013  ? Other and unspecified hyperlipidemia 10/02/2013  ? GERD (gastroesophageal reflux disease) 10/02/2013  ? H/O vitamin D deficiency 10/02/2013  ? ?Past Medical History:  ?Diagnosis Date  ? GERD (gastroesophageal reflux disease)   ? Hiatal hernia   ? Hyperlipidemia   ? Hypertension   ? Vitamin D deficiency   ?  ?Family History  ?Problem Relation Age of Onset  ? Diabetes Mother   ? Hypertension Mother   ? Hyperlipidemia Mother   ? Breast cancer Mother 36  ? Hypertension Father   ? Hyperlipidemia Father   ? Heart disease Father   ? CAD Father   ? Hypertension Sister   ? Hypertension Sister   ?  ?History reviewed. No pertinent surgical history. ?Social History  ? ?Occupational History  ? Occupation: Research scientist (medical)   ?  Employer: [employer]  ?Tobacco Use  ? Smoking status: Never  ? Smokeless tobacco: Never  ?Vaping Use  ? Vaping Use: Never used  ?Substance and Sexual Activity  ? Alcohol use: No  ? Drug use: No  ? Sexual activity: Not on file  ? ? ? ? ? ? ?

## 2021-08-16 ENCOUNTER — Other Ambulatory Visit: Payer: Self-pay | Admitting: Obstetrics and Gynecology

## 2021-08-16 DIAGNOSIS — Z1231 Encounter for screening mammogram for malignant neoplasm of breast: Secondary | ICD-10-CM

## 2021-09-13 ENCOUNTER — Ambulatory Visit: Payer: BC Managed Care – PPO

## 2021-09-21 ENCOUNTER — Ambulatory Visit
Admission: RE | Admit: 2021-09-21 | Discharge: 2021-09-21 | Disposition: A | Discharge status: Home / Self Care | Payer: BC Managed Care – PPO | Source: Ambulatory Visit | Attending: Obstetrics and Gynecology | Admitting: Obstetrics and Gynecology

## 2021-09-21 DIAGNOSIS — Z1231 Encounter for screening mammogram for malignant neoplasm of breast: Secondary | ICD-10-CM

## 2021-10-10 DIAGNOSIS — D649 Anemia, unspecified: Secondary | ICD-10-CM | POA: Diagnosis not present

## 2021-10-10 DIAGNOSIS — I10 Essential (primary) hypertension: Secondary | ICD-10-CM | POA: Diagnosis not present

## 2021-10-10 DIAGNOSIS — R609 Edema, unspecified: Secondary | ICD-10-CM | POA: Diagnosis not present

## 2021-11-11 DIAGNOSIS — D649 Anemia, unspecified: Secondary | ICD-10-CM | POA: Diagnosis not present

## 2021-11-25 DIAGNOSIS — Z86010 Personal history of colonic polyps: Secondary | ICD-10-CM | POA: Diagnosis not present

## 2021-11-25 DIAGNOSIS — D509 Iron deficiency anemia, unspecified: Secondary | ICD-10-CM | POA: Diagnosis not present

## 2022-08-08 ENCOUNTER — Other Ambulatory Visit (HOSPITAL_COMMUNITY): Payer: Self-pay | Admitting: Pain Medicine

## 2022-08-08 DIAGNOSIS — E7800 Pure hypercholesterolemia, unspecified: Secondary | ICD-10-CM

## 2022-08-16 ENCOUNTER — Ambulatory Visit (HOSPITAL_BASED_OUTPATIENT_CLINIC_OR_DEPARTMENT_OTHER)
Admission: RE | Admit: 2022-08-16 | Discharge: 2022-08-16 | Disposition: A | Discharge status: Home / Self Care | Payer: Self-pay | Source: Ambulatory Visit | Attending: Pain Medicine | Admitting: Pain Medicine

## 2022-08-16 DIAGNOSIS — E7800 Pure hypercholesterolemia, unspecified: Secondary | ICD-10-CM | POA: Insufficient documentation

## 2022-10-02 ENCOUNTER — Other Ambulatory Visit: Payer: Self-pay | Admitting: Family Medicine

## 2022-10-02 DIAGNOSIS — Z1231 Encounter for screening mammogram for malignant neoplasm of breast: Secondary | ICD-10-CM

## 2022-10-11 ENCOUNTER — Inpatient Hospital Stay
Admission: RE | Admit: 2022-10-11 | Discharge: 2022-10-11 | Discharge status: Home / Self Care | Payer: 59 | Source: Ambulatory Visit | Attending: Family Medicine | Admitting: Family Medicine

## 2022-10-11 DIAGNOSIS — Z1231 Encounter for screening mammogram for malignant neoplasm of breast: Secondary | ICD-10-CM

## 2023-02-28 DIAGNOSIS — H00021 Hordeolum internum right upper eyelid: Secondary | ICD-10-CM | POA: Diagnosis not present

## 2023-03-30 DIAGNOSIS — L723 Sebaceous cyst: Secondary | ICD-10-CM | POA: Diagnosis not present

## 2023-04-10 ENCOUNTER — Other Ambulatory Visit: Payer: Self-pay | Admitting: Family Medicine

## 2023-04-10 DIAGNOSIS — Z136 Encounter for screening for cardiovascular disorders: Secondary | ICD-10-CM | POA: Diagnosis not present

## 2023-04-10 DIAGNOSIS — M8580 Other specified disorders of bone density and structure, unspecified site: Secondary | ICD-10-CM | POA: Diagnosis not present

## 2023-04-10 DIAGNOSIS — Z23 Encounter for immunization: Secondary | ICD-10-CM | POA: Diagnosis not present

## 2023-04-10 DIAGNOSIS — H409 Unspecified glaucoma: Secondary | ICD-10-CM | POA: Diagnosis not present

## 2023-04-10 DIAGNOSIS — Z0000 Encounter for general adult medical examination without abnormal findings: Secondary | ICD-10-CM | POA: Diagnosis not present

## 2023-04-10 DIAGNOSIS — Z6829 Body mass index (BMI) 29.0-29.9, adult: Secondary | ICD-10-CM | POA: Diagnosis not present

## 2023-04-10 DIAGNOSIS — K219 Gastro-esophageal reflux disease without esophagitis: Secondary | ICD-10-CM | POA: Diagnosis not present

## 2023-04-10 DIAGNOSIS — E7800 Pure hypercholesterolemia, unspecified: Secondary | ICD-10-CM | POA: Diagnosis not present

## 2023-04-10 DIAGNOSIS — E663 Overweight: Secondary | ICD-10-CM | POA: Diagnosis not present

## 2023-04-10 DIAGNOSIS — I10 Essential (primary) hypertension: Secondary | ICD-10-CM | POA: Diagnosis not present

## 2023-04-13 DIAGNOSIS — H401131 Primary open-angle glaucoma, bilateral, mild stage: Secondary | ICD-10-CM | POA: Diagnosis not present

## 2023-07-04 DIAGNOSIS — I10 Essential (primary) hypertension: Secondary | ICD-10-CM | POA: Diagnosis not present

## 2023-08-01 DIAGNOSIS — E7800 Pure hypercholesterolemia, unspecified: Secondary | ICD-10-CM | POA: Diagnosis not present

## 2023-08-02 DIAGNOSIS — I10 Essential (primary) hypertension: Secondary | ICD-10-CM | POA: Diagnosis not present

## 2023-08-02 DIAGNOSIS — M67441 Ganglion, right hand: Secondary | ICD-10-CM | POA: Diagnosis not present

## 2023-09-01 DIAGNOSIS — I10 Essential (primary) hypertension: Secondary | ICD-10-CM | POA: Diagnosis not present

## 2023-09-16 DIAGNOSIS — I10 Essential (primary) hypertension: Secondary | ICD-10-CM | POA: Diagnosis not present

## 2023-10-01 DIAGNOSIS — I10 Essential (primary) hypertension: Secondary | ICD-10-CM | POA: Diagnosis not present

## 2023-10-03 DIAGNOSIS — M67441 Ganglion, right hand: Secondary | ICD-10-CM | POA: Diagnosis not present

## 2023-10-04 DIAGNOSIS — I10 Essential (primary) hypertension: Secondary | ICD-10-CM | POA: Diagnosis not present

## 2023-10-04 DIAGNOSIS — Z23 Encounter for immunization: Secondary | ICD-10-CM | POA: Diagnosis not present

## 2023-10-04 DIAGNOSIS — E663 Overweight: Secondary | ICD-10-CM | POA: Diagnosis not present

## 2023-10-04 DIAGNOSIS — E7800 Pure hypercholesterolemia, unspecified: Secondary | ICD-10-CM | POA: Diagnosis not present

## 2023-10-04 DIAGNOSIS — I2510 Atherosclerotic heart disease of native coronary artery without angina pectoris: Secondary | ICD-10-CM | POA: Diagnosis not present

## 2023-10-15 DIAGNOSIS — H5213 Myopia, bilateral: Secondary | ICD-10-CM | POA: Diagnosis not present

## 2023-10-16 DIAGNOSIS — I10 Essential (primary) hypertension: Secondary | ICD-10-CM | POA: Diagnosis not present

## 2023-10-18 DIAGNOSIS — M67441 Ganglion, right hand: Secondary | ICD-10-CM | POA: Diagnosis not present

## 2023-10-31 DIAGNOSIS — I10 Essential (primary) hypertension: Secondary | ICD-10-CM | POA: Diagnosis not present

## 2023-11-16 DIAGNOSIS — I10 Essential (primary) hypertension: Secondary | ICD-10-CM | POA: Diagnosis not present

## 2023-11-29 DIAGNOSIS — H401131 Primary open-angle glaucoma, bilateral, mild stage: Secondary | ICD-10-CM | POA: Diagnosis not present

## 2023-12-12 ENCOUNTER — Other Ambulatory Visit

## 2023-12-16 DIAGNOSIS — I10 Essential (primary) hypertension: Secondary | ICD-10-CM | POA: Diagnosis not present

## 2023-12-27 ENCOUNTER — Other Ambulatory Visit (HOSPITAL_BASED_OUTPATIENT_CLINIC_OR_DEPARTMENT_OTHER): Payer: Medicare (Managed Care)

## 2024-10-02 ENCOUNTER — Other Ambulatory Visit (HOSPITAL_BASED_OUTPATIENT_CLINIC_OR_DEPARTMENT_OTHER): Payer: Medicare (Managed Care)
